# Patient Record
Sex: Female | Born: 1995 | Hispanic: Yes | Marital: Single | Smoking: Never smoker
Health system: Southern US, Community
[De-identification: ages and names within clinical notes are randomized; demographics above are authoritative.]

## PROBLEM LIST (undated history)

## (undated) DIAGNOSIS — F938 Other childhood emotional disorders: Secondary | ICD-10-CM

## (undated) DIAGNOSIS — F332 Major depressive disorder, recurrent severe without psychotic features: Secondary | ICD-10-CM

---
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
## 2017-11-23 ENCOUNTER — Ambulatory Visit: Payer: Self-pay | Admitting: Internal Medicine

## 2017-11-23 ENCOUNTER — Encounter: Payer: Self-pay | Admitting: Internal Medicine

## 2017-11-23 VITALS — BP 122/76 | HR 80 | Resp 12 | Ht <= 58 in | Wt 139.0 lb

## 2017-11-23 DIAGNOSIS — F332 Major depressive disorder, recurrent severe without psychotic features: Secondary | ICD-10-CM

## 2017-11-23 DIAGNOSIS — F938 Other childhood emotional disorders: Secondary | ICD-10-CM

## 2017-11-23 DIAGNOSIS — J302 Other seasonal allergic rhinitis: Secondary | ICD-10-CM

## 2017-11-23 DIAGNOSIS — Z30011 Encounter for initial prescription of contraceptive pills: Secondary | ICD-10-CM

## 2017-11-23 LAB — POCT URINE PREGNANCY: Preg Test, Ur: NEGATIVE

## 2017-11-23 MED ORDER — NORGESTIMATE-ETH ESTRADIOL 0.25-35 MG-MCG PO TABS: 1.0000 | ORAL_TABLET | Freq: Every day | ORAL | 4 refills | Status: DC

## 2017-11-23 MED ORDER — MOMETASONE FUROATE 50 MCG/ACT NA SUSP: NASAL | 11 refills | Status: DC

## 2017-11-23 MED ORDER — FEXOFENADINE HCL 180 MG PO TABS
ORAL_TABLET | ORAL | Status: DC
Start: 2017-11-23 — End: 2020-09-05

## 2017-11-23 NOTE — Progress Notes (Signed)
Subjective:    Patient ID: Rebekah Riddle, female    DOB: Jun 14, 1996, 22 y.o.   MRN: [id number]  HPI   Here to establish  1.  Nasal congestion that comes and goes over the past 2 months.  Started after green yellow pollen noted on cars, etc.  Clear nasal discharge.  Sneezing a lot.  More in the morning.  She has never had this before.  Obtained Allegra D 180 mg last week and has helped, but not taking regularly.  She also obtained Nasocort nasal spray, but only using when she has significant nasal stuffiness and feels that it did not help. Does have a lot of stuffed animals on bed, no carpeting.  Again, has not had these symptoms previously.   2.  Birth Control: interested in starting.  Has never been on birth control.  Hs been sexually active since age 21.  Has one long term female partner.   Menarche:  15 Periods regular, cramping first couple of days, but not heavy flow.  Lasts about 7 days.  Can have breast tenderness and bloating associated. No history of clotting disorder in family or patient. Nonsmoker.   No family history of breast cancer.  Last intercourse without a condom was 3 days ago.  Intercourse twice weekly sometimes without condoms.  Current Meds  Medication Sig  . Fexofenadine-Pseudoephedrine (ALLEGRA-D PO) Take by mouth as needed.    No Known Allergies   Past Medical History:  Diagnosis Date  . 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 MDD (major depressive disorder), recurrent episode, severe (HCC) 04/29/2015   attempted suicide and hospitalization.    No past surgical history on file.   Family History  Problem Relation Age of Onset  . Hypertension Mother   . Depression Mother   . Diabetes Father   . Hyperlipidemia Father   . Depression Sister   . Diabetes Brother   . Depression Sister     Social History   Socioeconomic History  . Marital status: Single    Spouse name: Not on file  . Number of children: 0  . Years of education: Not on  file  . Highest education level: 11th grade  Occupational History  . Not on file  Social Needs  . Financial resource strain: Not on file  . Food insecurity:    Worry: Not on file    Inability: Not on file  . Transportation needs:    Medical: Not on file    Non-medical: Not on file  Tobacco Use  . Smoking status: Never Smoker  . Smokeless tobacco: Never Used  Substance and Sexual Activity  . Alcohol use: Yes    Comment: once monthly  . Drug use: No  . Sexual activity: Never    Birth control/protection: Condom, Coitus interruptus  Lifestyle  . Physical activity:    Days per week: Not on file    Minutes per session: Not on file  . Stress: Not on file  Relationships  . Social connections:    Talks on phone: Not on file    Gets together: Not on file    Attends religious service: Not on file    Active member of club or organization: Not on file    Attends meetings of clubs or organizations: Not on file    Relationship status: Not on file  . Intimate partner violence:    Fear of current or ex partner: Not on file    Emotionally abused: Not on file  Physically abused: Not on file    Forced sexual activity: Not on file  Other Topics Concern  . Not on file  Social History Narrative  . Not on file    Review of Systems     Objective:   Physical Exam NAD HEENT:  PERRL, EOMI, conjunctivae without injection.  TMs pearly gray, throat with mild injection and no exudate.  Nasal mucosa boggy with clear discharge. Neck:  Supple, No adenopathy Chest:  CTA CV:  RRR with normal S1 and S2, No S3, S4 or murmur.  Radial and DP pulses normal and equal Abd:  S, NT, No HSM or mass, + BS       Assessment & Plan:  1.  Allergies: Sounds like really a pollen allergy.  To try fexofenadine 180 mg daily and use D form only when a lot of nasal congestion.  Encouraged use of nasonex regularly as well until pollen done.  2.  Family planning counseling:  Urine HCG negative today, but asked  she not start BCPs until the Sunday after her next period starts.  To use condoms until then and would recommend continuing condom usage until she has completed her first month.   Discussed need to take pill the same time each day.  Followup in 3 months for CPE without pap, but with STD testing.  3.  Depression/anxiety:  Have spoken with Samul DadaN. Knight. LCSW to contact patient regarding counseling.

## 2017-12-07 ENCOUNTER — Other Ambulatory Visit: Payer: Self-pay | Admitting: Licensed Clinical Social Worker

## 2017-12-20 ENCOUNTER — Other Ambulatory Visit: Payer: Self-pay | Admitting: Licensed Clinical Social Worker

## 2017-12-27 ENCOUNTER — Other Ambulatory Visit: Payer: Self-pay | Admitting: Licensed Clinical Social Worker

## 2018-01-03 ENCOUNTER — Ambulatory Visit: Payer: Self-pay | Admitting: Licensed Clinical Social Worker

## 2018-01-03 DIAGNOSIS — F3341 Major depressive disorder, recurrent, in partial remission: Secondary | ICD-10-CM

## 2018-01-03 DIAGNOSIS — F418 Other specified anxiety disorders: Secondary | ICD-10-CM
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
## 2018-01-18 ENCOUNTER — Ambulatory Visit: Payer: Self-pay | Admitting: Licensed Clinical Social Worker

## 2018-01-18 DIAGNOSIS — F3341 Major depressive disorder, recurrent, in partial remission: Secondary | ICD-10-CM

## 2018-01-18 DIAGNOSIS — F418 Other specified anxiety disorders: Secondary | ICD-10-CM
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
## 2018-02-04 ENCOUNTER — Other Ambulatory Visit: Payer: Self-pay | Admitting: Licensed Clinical Social Worker

## 2018-02-09 ENCOUNTER — Other Ambulatory Visit: Payer: Self-pay | Admitting: Licensed Clinical Social Worker

## 2018-02-23 ENCOUNTER — Ambulatory Visit: Payer: Self-pay | Admitting: Licensed Clinical Social Worker

## 2018-02-23 DIAGNOSIS — F418 Other specified anxiety disorders: Secondary | ICD-10-CM

## 2018-02-23 DIAGNOSIS — F3341 Major depressive disorder, recurrent, in partial remission: Secondary | ICD-10-CM

## 2018-02-25 ENCOUNTER — Encounter: Payer: Self-pay | Admitting: Internal Medicine

## 2018-02-25 ENCOUNTER — Ambulatory Visit: Payer: Self-pay | Admitting: Internal Medicine

## 2018-02-25 VITALS — BP 122/82 | HR 68 | Resp 12 | Ht <= 58 in | Wt 142.0 lb

## 2018-02-25 DIAGNOSIS — F332 Major depressive disorder, recurrent severe without psychotic features: Secondary | ICD-10-CM

## 2018-02-25 MED ORDER — BUPROPION HCL ER (XL) 150 MG PO TB24: ORAL_TABLET | ORAL | 1 refills | Status: DC

## 2018-02-25 MED ORDER — CLONAZEPAM 1 MG PO TABS: ORAL_TABLET | ORAL | 0 refills | Status: DC

## 2018-02-25 NOTE — Progress Notes (Signed)
   Subjective:    Patient ID: Rebekah SandhoffJaqueline Riddle, female    DOB: 12/19/1995, 22 y.o.   MRN: [id number]  HPI  1.  Family planning:  [facility] and is not interested in undergoing STD check with pelvic exam planned for today.  She has spoken to her sisters and others and decided she did not want to risk side effects of BCPs--not feeling well, gaining weight, etc.  2.  Depression:  Felt she developed depression first at age 22 yo.   Her symptoms are fatigue, body aches.  She has bad dreams and poor sleep.  Never awakens feeling rested.  Poor motivation.  Overly sensitive. These symptoms started up again with stressors Jan of this year. Attempted suicide at age 22 04/2015 that led to 1 week hospitalization.   She was placed on Fluoxetine 20 mg daily, but only took for 1 month.   She did not follow up as recommended with counseling.   She has not had suicidal thoughts anytime recently.   About 6 nights ago, had a bad night where she had an extreme fear of dying and thought someone was in her room to harm her.  Could not get this out of her mind.  She was not hearing or seeing anything.   No history of manic or hypomanic symptoms.   Was encouraged by Samul DadaN. Knight, LCSW to get back on medication with her more recent symptoms. She [facility].  No history of seizure.  No outpatient medications have been marked as taking for the 02/25/18 encounter (Office Visit) with Julieanne MansonMulberry, Dashauna Heymann, MD.    No Known Allergies  Review of Systems     Objective:   Physical Exam NAD Lungs:  CTA CV: RRR without murmur or rub.  Radial pulses normal and equal       Assessment & Plan:  1.  Family Planning:  Encouraged to continue with regular condom use for now.  2.  Depression:  Start Bupropion XL 150 mg once daily in morning.  Small Rx for Clonazepam 1 mg 1/2 to 1 tab twice daily as needed if increased anxiety with start of Buproprion. Follow up in 1  week.

## 2018-03-02 ENCOUNTER — Other Ambulatory Visit: Payer: Self-pay | Admitting: Licensed Clinical Social Worker

## 2018-03-02 NOTE — Progress Notes (Signed)
   THERAPY PROGRESS NOTE  Session Time: 60min  Participation Level: Active  Behavioral Response: Neat and Well GroomedAlertAnxious  Type of Therapy: Individual Therapy  Treatment Goals addressed: Coping  Interventions: Motivational Interviewing and Supportive  Summary: Rebekah Riddle is a [name] y.o. female who presents with an anxious mood and appropriate affect. She reported that a few days ago, she had some sort of "anxiety attack" where she began thinking deeply about death and then got incredibly scared. She shared that she was shaking, upset, and couldn't sleep the whole night. She expressed the intense fears that she feels when she thinks about the fact that she will die someday. She shared that she is coping by trying to not think about death anymore. She described the comfort that she got from her mother, which was not very supportive. Rebekah Riddle acknowledged that she might need to try an antidepressant because despite her efforts, she feels down, fatigued, and unmotivated. She shared that she is considering leaving her GED program so that she can go to work.   Suicidal/Homicidal: Nowithout intent/plan  Therapist Response: LCSW utilized supportive counseling techniques throughout the session in order to validate emotions and encourage open expression of emotion. LCSW taught Rebekah Riddle several relaxation techniques, including accupressure. LCSW encouraged her to talk with Dr. Delrae AlfredMulberry about an antidepressant. LCSW used motivation techniques to allow Rebekah Riddle think through the short-term and long-term consequences of quitting her GED program.  Plan: Return again in 2 weeks.   Rebekah Simmeratosha Kenneth Lax, LCSW 03/02/2018

## 2018-03-04 ENCOUNTER — Ambulatory Visit: Payer: Self-pay | Admitting: Licensed Clinical Social Worker

## 2018-03-04 DIAGNOSIS — F418 Other specified anxiety disorders: Secondary | ICD-10-CM

## 2018-03-04 DIAGNOSIS — F3341 Major depressive disorder, recurrent, in partial remission: Secondary | ICD-10-CM

## 2018-03-07 NOTE — Progress Notes (Signed)
   THERAPY PROGRESS NOTE  Session Time: 45min  Participation Level: Active  Behavioral Response: Neat and Well GroomedAlertEuthymic  Type of Therapy: Individual Therapy  Treatment Goals addressed: Coping  Interventions: Strength-based and Supportive  Summary: Rebekah SandhoffJaqueline Riddle is a 22 y.o. female who presents with a euthymic mood and appropriate affect. She reported that she has been feeling better since starting an antidepressant. She shared that she is still struggling with insomnia but agreed to try some of LCSW suggestions -- no screen time, reading a book, and using acupressure. Rebekah Riddle shared that she sometimes worries that she is not going out enough for a young person, as she often prefers to stay home. She discussed the benefits to going out versus staying home. She expressed concerns that she has grown distant from most of her friends over the past year. She acknowledged that she needs to work on being okay with enjoying time by herself.    Suicidal/Homicidal: Nowithout intent/plan  Therapist Response: LCSW utilized supportive counseling techniques throughout the session in order to validate emotions and encourage open expression of emotion. LCSW checked in regarding medication compliance and health issues. LCSW guided Rebekah Riddle through weighing the pros and cons of going out and helping her to identify the reasons that she wants to go out versus stay in.   Plan: Return again in 2 weeks.   Nilda Simmeratosha Yaser Harvill, LCSW 03/07/2018

## 2018-03-09 ENCOUNTER — Ambulatory Visit: Payer: Self-pay | Admitting: Internal Medicine

## 2018-03-11 ENCOUNTER — Ambulatory Visit: Payer: Self-pay | Admitting: Internal Medicine

## 2018-03-11 ENCOUNTER — Encounter: Payer: Self-pay | Admitting: Internal Medicine

## 2018-03-11 VITALS — BP 124/78 | HR 78 | Resp 12 | Ht <= 58 in | Wt 141.0 lb

## 2018-03-11 DIAGNOSIS — M545 Low back pain, unspecified: Secondary | ICD-10-CM

## 2018-03-11 DIAGNOSIS — F332 Major depressive disorder, recurrent severe without psychotic features: Secondary | ICD-10-CM

## 2018-03-11 NOTE — Progress Notes (Signed)
   Subjective:    Patient ID: Rebekah Riddle, female    DOB: 12-22-1995, 22 y.o.   MRN: [id number]  HPI  Depression:  States she is taking the Bupropion XL 150 mg daily in the morning and she feels energized and more awake.  Definitely more motivated. Took the Clonazepam 5 mg twice at bedtime for sleep and that helped as well.  She has not taken a Clonazepam since last week, but still with sleep. She has noted she is being more active during day with use of the Bupropion and is finding herself more sleepy toward bedtime now, however.  No thoughts of suicide.   She is not having the fears of dying as well.   She is not aware if anyone else in her family feels there is a difference in her. She did see Rebekah SimmerNatosha Knight, LCSW last week and will see her again next week.  Working on goals. She is making goals for bedtime and wake time.  Also brings up mid to low back pain after some heavy lifting last week.  Her back feels "tired"  Current Meds  Medication Sig  . buPROPion (WELLBUTRIN XL) 150 MG 24 hr tablet 1 tab by mouth every morning with breakfast  . clonazePAM (KLONOPIN) 1 MG tablet 1/2 to 1 tab by mouth twice daily as needed  . ibuprofen (ADVIL,MOTRIN) 200 MG tablet Take 200 mg by mouth every 8 (eight) hours as needed for cramping.    No Known Allergies Review of Systems     Objective:   Physical Exam NAD Lungs:  CTA CV:  RRR  MS:  Moves easily.  NT over spinous processes or paraspinous musculature. Neuro:  Motor 5/5, DTRs 2+/4.  Sensory of LE to light touch normal.        Assessment & Plan:  1.  Depression:  Continue bupropion.  Follow up in 4 weeks.  2.  Low back pain:  Appears mild.  Ibuprofen as needed.

## 2018-03-16 ENCOUNTER — Other Ambulatory Visit: Payer: Self-pay | Admitting: Licensed Clinical Social Worker

## 2018-03-25 ENCOUNTER — Ambulatory Visit: Payer: Self-pay | Admitting: Licensed Clinical Social Worker

## 2018-03-25 DIAGNOSIS — F418 Other specified anxiety disorders: Secondary | ICD-10-CM

## 2018-03-25 DIAGNOSIS — F3341 Major depressive disorder, recurrent, in partial remission: Secondary | ICD-10-CM

## 2018-03-28 NOTE — Progress Notes (Signed)
   THERAPY PROGRESS NOTE  Session Time: 60min  Participation Level: Active  Behavioral Response: Neat and Well GroomedAlertEuthymic  Type of Therapy: Individual Therapy  Treatment Goals addressed: Coping  Interventions: Strength-based and Supportive  Summary: Rebekah SandhoffJaqueline Riddle is a 22 y.o. female who presents with a euthymic mood and appropriate affect. She reported that she was still taking her antidepressant but felt that it wasn't doing anything and was considering stopping. She appeared receptive to LCSW feedback about sticking with it for a few more weeks before considering stopping. She reported that she did not have any negative side effects. 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 She expressed excitement to get her first paycheck and be able to get some things she wants and needs. She shared about difficulties with her boyfriend, which are making her feel very down and lonely. Rebekah Riddle and LCSW processed about their relationship and discussed what she would like to happen. She acknowledged that they probably need to break up but that it's very difficult.   Suicidal/Homicidal: Nowithout intent/plan  Therapist Response: LCSW utilized supportive counseling techniques throughout the session in order to validate emotions and encourage open expression of emotion. LCSW checked in regarding medication compliance and health issues. LCSW encouraged Rebekah Riddle to continue to taking the medication until her next doctor visit. LCSW and Rebekah Riddle processed about her relationship; LCSW normalized people growing apart as they age and mature. LCSW emphasized that Rebekah Riddle needs to think about what she wants out of the relationship and make a decision based on that.  Plan: Return again in 2 weeks.    Rebekah Simmeratosha Alayzia Pavlock, LCSW 03/28/2018

## 2018-04-05 ENCOUNTER — Other Ambulatory Visit: Payer: Self-pay | Admitting: Licensed Clinical Social Worker

## 2018-04-19 ENCOUNTER — Ambulatory Visit: Payer: Self-pay | Admitting: Internal Medicine

## 2018-04-19 ENCOUNTER — Encounter: Payer: Self-pay | Admitting: Internal Medicine

## 2018-04-19 VITALS — BP 122/72 | HR 70 | Resp 12 | Ht <= 58 in | Wt 140.0 lb

## 2018-04-19 DIAGNOSIS — Z3009 Encounter for other general counseling and advice on contraception: Secondary | ICD-10-CM

## 2018-04-19 DIAGNOSIS — F332 Major depressive disorder, recurrent severe without psychotic features: Secondary | ICD-10-CM

## 2018-04-19 MED ORDER — BUPROPION HCL ER (XL) 150 MG PO TB24: ORAL_TABLET | ORAL | 6 refills | Status: DC

## 2018-04-19 NOTE — Patient Instructions (Signed)
Take the Plan B Use condoms until next period and after period starts, start the pills the very next Sunday

## 2018-04-19 NOTE — Progress Notes (Signed)
   Subjective:    Patient ID: Rebekah Riddle, female    DOB: 10/17/1995, 22 y.o.   MRN: [id number]  HPI   Depression:  Feeling much better.  She now has a job.  Decided to drop out of GED classes as she spends so many hours with her job. She stopped the Bupropion 2 weeks ago as she did not see it was helping her.  She was not depressed, but her reasoning is that she did not note "a difference".   Relates she does not always feel depressed and so she may not need the antidepressant. Discussed the possibility that the medication and counseling may reduce the number of days in a larger period of time when she is not depressed.    Family Planning:  Today states never started Sprintec 28 days when prescribed back in April.  Difficult getting a response as to why.  She states she is bad about taking a pill every day. She states she used Plan B today. She and her partner did not use condoms with intercourse last night..    Current Meds  Medication Sig  . clonazePAM (KLONOPIN) 1 MG tablet 1/2 to 1 tab by mouth twice daily as needed      Review of Systems     Objective:   Physical Exam NAD Upbeat        Assessment & Plan:  1.  Depression:  Encouraged patient to rethink her treatment.  Did not go to last counseling session.  Stopped Bupropion. Discussed expectation of medication is to not have depressive symptoms.  Also, that she should consider giving the medication a longer trial and assess at regular intervals if her days feeling depressed are decreased from what they have been in past 4 years. Encouraged at least a 6 month trial and in reality, recommend indefinite usage with her history of depression, though at minimum, recommend a year's treatment before considering tapering off.  2.  Family planning:  Patient actually to take Plan B later today for unprotected sex.  Encouraged waiting for next period and begin BCPs the Sunday immediately after her period starts. She has the  prescription still. Discussed also Depo provera, IUD, implant, patch.  Would recommend [facility] for Depo, implant or IUD.

## 2018-06-13 ENCOUNTER — Ambulatory Visit: Payer: Self-pay | Admitting: Internal Medicine

## 2018-06-13 ENCOUNTER — Encounter: Payer: Self-pay | Admitting: Internal Medicine

## 2018-06-13 VITALS — BP 122/70 | HR 86 | Resp 12 | Ht <= 58 in | Wt 142.0 lb

## 2018-06-13 DIAGNOSIS — M62838 Other muscle spasm: Secondary | ICD-10-CM

## 2018-06-13 MED ORDER — CYCLOBENZAPRINE HCL 10 MG PO TABS: ORAL_TABLET | ORAL | 0 refills | Status: DC

## 2018-06-13 MED ORDER — IBUPROFEN 200 MG PO TABS: ORAL_TABLET | ORAL | Status: DC

## 2018-06-13 NOTE — Progress Notes (Signed)
   Subjective:    Patient ID: Rebekah Riddle, female    DOB: 11-27-1995, 22 y.o.   MRN: [id number]  HPI  Right upper back pain for past month.  Comes and goes.  Awakened this morning with the pain again.  Cannot get comfortable.  No numbness or tingling down arm or hand. Hurts to flex her neck downward.  No history of injury of which she is aware.    Has taken ibuprofen 400 mg.  Helps somewhat.   Current Meds  Medication Sig  . ibuprofen (ADVIL,MOTRIN) 200 MG tablet 3 tabs by mouth twice daily with meals for 2 weeks then as needed  . [DISCONTINUED] clonazePAM (KLONOPIN) 1 MG tablet 1/2 to 1 tab by mouth twice daily as needed  . [DISCONTINUED] ibuprofen (ADVIL,MOTRIN) 200 MG tablet Take 200 mg by mouth every 8 (eight) hours as needed for cramping.    No Known Allergies Review of Systems     Objective:   Physical Exam  NAD HEENT: PERRL, EOMI, TMs pearly gray, throat without injection. Neck:  Supple, No adenopathy.  Quite tender over right trap at mid belly with palpable spasm.  NT over spinous processes. Chest:  CTA CV:  RRR without murmur or rub.  Radial and DP pulses normal and equal Neuro:  A & O x 3, CN  II-XII grossly intact.  DTRs 2+/4, Motor 5/5, sensory grossly normal.  Gait normal      Assessment & Plan:  1.  Right trap and neck Pain:  Ibuprofen twice daily for 2 weeks.  Cyclobenzaprine as needed at bedtime.   PT referral to [facility].

## 2018-06-13 NOTE — Patient Instructions (Signed)
Warm pack neck and then stretch prior to bedtime  [facility]:  [phone]

## 2018-06-28 ENCOUNTER — Ambulatory Visit: Payer: Self-pay | Admitting: Internal Medicine

## 2018-08-14 ENCOUNTER — Encounter: Payer: Self-pay | Admitting: Internal Medicine

## 2018-09-13 ENCOUNTER — Ambulatory Visit: Payer: Self-pay | Admitting: Internal Medicine

## 2018-09-17 ENCOUNTER — Encounter: Payer: Self-pay | Admitting: Internal Medicine

## 2018-10-14 ENCOUNTER — Ambulatory Visit: Payer: Self-pay | Admitting: Internal Medicine

## 2020-09-05 ENCOUNTER — Other Ambulatory Visit: Payer: Self-pay

## 2020-09-05 ENCOUNTER — Ambulatory Visit
Admission: EM | Admit: 2020-09-05 | Discharge: 2020-09-05 | Disposition: A | Discharge status: Home / Self Care | Payer: Self-pay | Attending: Emergency Medicine | Admitting: Emergency Medicine

## 2020-09-05 DIAGNOSIS — J029 Acute pharyngitis, unspecified: Secondary | ICD-10-CM

## 2020-09-05 LAB — POCT RAPID STREP A (OFFICE): Rapid Strep A Screen: NEGATIVE

## 2020-09-05 MED ORDER — PREDNISONE 10 MG (21) PO TBPK: ORAL_TABLET | Freq: Every day | ORAL | 0 refills | Status: DC

## 2020-09-05 NOTE — Discharge Instructions (Addendum)

## 2020-09-05 NOTE — ED Triage Notes (Signed)
Pt c/o sore throat x1wk, now having swollen tonsils.

## 2020-09-05 NOTE — ED Provider Notes (Signed)
[facility]    CSN: [id number] Arrival date & time: 09/05/20  1616      History   Chief Complaint Chief Complaint  Patient presents with  . Sore Throat    HPI Rebekah Riddle is a 25 y.o. female  History was provided by the patient. Rebekah Riddle is a 25 y.o. female who presents for evaluation of a sore throat. Associated symptoms include sore throat. Onset of symptoms was 1 week ago, gradually worsening since that time.  She is drinking plenty of fluids. She has not had recent close exposure to someone with proven streptococcal pharyngitis. The following portions of the patient's history were reviewed and updated as appropriate: allergies, current medications, past family history, past medical history, past social history, past surgical history and problem list.     Past Medical History:  Diagnosis Date  . 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 MDD (major depressive disorder), recurrent episode, severe (HCC) 04/29/2015   attempted suicide and hospitalization.    Patient Active Problem List   Diagnosis Date Noted  . MDD (major depressive disorder), recurrent episode, severe (HCC) 04/29/2015  . 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 No pertinent surgical history.  OB History   No obstetric history on file.      Home Medications    Prior to Admission medications   Medication Sig Start Date End Date Taking? Authorizing Provider  predniSONE (STERAPRED UNI-PAK 21 TAB) 10 MG (21) TBPK tablet Take by mouth daily. Take steroid taper as written 09/05/20  Yes Hall-Potvin, [place], PA-C    Family History Family History  Problem Relation Age of Onset  . Hypertension Mother   . Depression Mother   . Diabetes Father   . Hyperlipidemia Father   . Depression Sister   . Diabetes Brother   . Depression Sister     Social History Social History   Tobacco Use  . Smoking status: Never Smoker  . Smokeless tobacco: Never  Used  Vaping Use  . Vaping Use: Never used  Substance Use Topics  . Alcohol use: Yes    Comment: once monthly  . Drug use: No     Allergies   Patient has no known allergies.   Review of Systems Review of Systems  Constitutional: Negative for fatigue and fever.  HENT: Positive for sore throat. Negative for congestion, dental problem, ear pain, facial swelling, hearing loss, sinus pain, trouble swallowing and voice change.   Eyes: Negative for photophobia, pain and visual disturbance.  Respiratory: Negative for cough and shortness of breath.   Cardiovascular: Negative for chest pain and palpitations.  Gastrointestinal: Negative for diarrhea and vomiting.  Musculoskeletal: Negative for arthralgias and myalgias.  Neurological: Negative for dizziness and headaches.     Physical Exam Triage Vital Signs ED Triage Vitals  Enc Vitals Group     BP      Pulse      Resp      Temp      Temp src      SpO2      Weight      Height      Head Circumference      Peak Flow      Pain Score      Pain Loc      Pain Edu?      Excl. in GC?    No data found.  Updated Vital Signs BP 126/85   Pulse 70   Temp 98.5 F (36.9  C)   Resp 16   LMP 08/25/2020   SpO2 99%   Visual Acuity Right Eye Distance:   Left Eye Distance:   Bilateral Distance:    Right Eye Near:   Left Eye Near:    Bilateral Near:     Physical Exam Constitutional:      General: She is not in acute distress.    Appearance: She is not ill-appearing or diaphoretic.  HENT:     Head: Normocephalic and atraumatic.     Right Ear: Tympanic membrane and ear canal normal.     Left Ear: Tympanic membrane and ear canal normal.     Mouth/Throat:     Mouth: Mucous membranes are moist.     Pharynx: Oropharynx is clear. Uvula midline. Posterior oropharyngeal erythema present. No oropharyngeal exudate or uvula swelling.     Tonsils: No tonsillar exudate. 2+ on the right. 2+ on the left.  Eyes:     General: No scleral  icterus.    Conjunctiva/sclera: Conjunctivae normal.     Pupils: Pupils are equal, round, and reactive to light.  Neck:     Comments: Trachea midline, negative JVD Cardiovascular:     Rate and Rhythm: Normal rate and regular rhythm.     Heart sounds: No murmur heard. No gallop.   Pulmonary:     Effort: Pulmonary effort is normal. No respiratory distress.     Breath sounds: No wheezing, rhonchi or rales.  Musculoskeletal:     Cervical back: Neck supple. No tenderness.  Lymphadenopathy:     Cervical: No cervical adenopathy.  Skin:    Capillary Refill: Capillary refill takes less than 2 seconds.     Coloration: Skin is not jaundiced or pale.     Findings: No rash.  Neurological:     General: No focal deficit present.     Mental Status: She is alert and oriented to person, place, and time.      UC Treatments / Results  Labs (all labs ordered are listed, but only abnormal results are displayed) Labs Reviewed  CULTURE, GROUP A STREP ([facility])  NOVEL CORONAVIRUS, NAA  POCT RAPID STREP A (OFFICE)    EKG   Radiology No results found.  Procedures Procedures (including critical care time)  Medications Ordered in UC Medications - No data to display  Initial Impression / Assessment and Plan / UC Course  I have reviewed the triage vital signs and the nursing notes.  Pertinent labs & imaging results that were available during my care of the patient were reviewed by me and considered in my medical decision making (see chart for details).     Patient afebrile, nontoxic, with SpO2 99%.  Rapid strep negative, culture and Covid PCR pending.  Patient to quarantine until results are back.  We will treat supportively as outlined below.  Return precautions discussed, patient verbalized understanding and is agreeable to plan. Final Clinical Impressions(s) / UC Diagnoses   Final diagnoses:  Sore throat     Discharge Instructions     Your rapid strep test was negative today.  The  culture is pending.  Please look on your MyChart for test results.   We will notify you if the culture positive and outline a treatment plan at that time.   Please continue Tylenol and/or Ibuprofen as needed for fever, pain.  May try warm salt water gargles, cepacol lozenges, throat spray, warm tea or water with lemon/honey, or OTC cold relief medicine for throat discomfort.   For  congestion: take a daily anti-histamine like Zyrtec, Claritin, and a oral decongestant to help with post nasal drip that may be irritating your throat.   It is important to stay hydrated: drink plenty of fluids (primarily water) to keep your throat moisturized and help further relieve irritation/discomfort.     ED Prescriptions    Medication Sig Dispense Auth. Provider   predniSONE (STERAPRED UNI-PAK 21 TAB) 10 MG (21) TBPK tablet Take by mouth daily. Take steroid taper as written 21 tablet Hall-Potvin, [place], PA-C     PDMP not reviewed this encounter.   Odette Fraction [place], [place] 09/05/20 1814

## 2020-09-06 LAB — SARS-COV-2, NAA 2 DAY TAT

## 2020-09-06 LAB — NOVEL CORONAVIRUS, NAA: SARS-CoV-2, NAA: NOT DETECTED

## 2020-09-08 LAB — CULTURE, GROUP A STREP (THRC)

## 2020-09-11 ENCOUNTER — Emergency Department (HOSPITAL_COMMUNITY)
Admission: EM | Admit: 2020-09-11 | Discharge: 2020-09-11 | Disposition: A | Discharge status: Home / Self Care | Payer: Self-pay | Attending: Emergency Medicine | Admitting: Emergency Medicine

## 2020-09-11 ENCOUNTER — Encounter (HOSPITAL_COMMUNITY): Payer: Self-pay | Admitting: Emergency Medicine

## 2020-09-11 DIAGNOSIS — J36 Peritonsillar abscess: Secondary | ICD-10-CM | POA: Insufficient documentation

## 2020-09-11 DIAGNOSIS — J029 Acute pharyngitis, unspecified: Secondary | ICD-10-CM

## 2020-09-11 LAB — COMPREHENSIVE METABOLIC PANEL
ALT: 14 U/L (ref 0–44)
AST: 16 U/L (ref 15–41)
Albumin: 3.9 g/dL (ref 3.5–5.0)
Alkaline Phosphatase: 60 U/L (ref 38–126)
Anion gap: 11 (ref 5–15)
BUN: 9 mg/dL (ref 6–20)
CO2: 25 mmol/L (ref 22–32)
Calcium: 9.1 mg/dL (ref 8.9–10.3)
Chloride: 101 mmol/L (ref 98–111)
Creatinine, Ser: 0.55 mg/dL (ref 0.44–1.00)
GFR, Estimated: >60 mL/min (ref >60)
Glucose, Bld: 90 mg/dL (ref 70–99)
Potassium: 3.4 mmol/L — ABNORMAL LOW (ref 3.5–5.1)
Sodium: 137 mmol/L (ref 135–145)
Total Bilirubin: 0.7 mg/dL (ref 0.3–1.2)
Total Protein: 7.4 g/dL (ref 6.5–8.1)

## 2020-09-11 LAB — CBC WITH DIFFERENTIAL/PLATELET
Abs Immature Granulocytes: 0.16 10*3/uL — ABNORMAL HIGH (ref 0.00–0.07)
Basophils Absolute: 0.1 10*3/uL (ref 0.0–0.1)
Basophils Relative: 0 %
Eosinophils Absolute: 0.2 10*3/uL (ref 0.0–0.5)
Eosinophils Relative: 1 %
HCT: 38.2 % (ref 36.0–46.0)
Hemoglobin: 11.7 g/dL — ABNORMAL LOW (ref 12.0–15.0)
Immature Granulocytes: 1 %
Lymphocytes Relative: 24 %
Lymphs Abs: 3.4 10*3/uL (ref 0.7–4.0)
MCH: 25.5 pg — ABNORMAL LOW (ref 26.0–34.0)
MCHC: 30.6 g/dL (ref 30.0–36.0)
MCV: 83.4 fL (ref 80.0–100.0)
Monocytes Absolute: 1.3 10*3/uL — ABNORMAL HIGH (ref 0.1–1.0)
Monocytes Relative: 10 %
Neutro Abs: 8.8 10*3/uL — ABNORMAL HIGH (ref 1.7–7.7)
Neutrophils Relative %: 64 %
Platelets: 398 10*3/uL (ref 150–400)
RBC: 4.58 MIL/uL (ref 3.87–5.11)
RDW: 13.5 % (ref 11.5–15.5)
WBC: 13.9 10*3/uL — ABNORMAL HIGH (ref 4.0–10.5)
nRBC: 0 % (ref 0.0–0.2)

## 2020-09-11 LAB — I-STAT BETA HCG BLOOD, ED (MC, WL, AP ONLY): I-stat hCG, quantitative: <5 m[IU]/mL (ref <5)

## 2020-09-11 MED ORDER — KETOROLAC TROMETHAMINE 60 MG/2ML IM SOLN
60.0000 mg | Freq: Once | INTRAMUSCULAR | Status: AC
  Administered 2020-09-11: 60 mg via INTRAMUSCULAR
  Filled 2020-09-11 (×2): qty 2

## 2020-09-11 MED ORDER — CLINDAMYCIN PALMITATE HCL 75 MG/5ML PO SOLR
300.0000 mg | Freq: Once | ORAL | Status: AC
  Administered 2020-09-11: 300 mg via ORAL
  Filled 2020-09-11: qty 20

## 2020-09-11 MED ORDER — BENZOCAINE 20 % MT AERO
INHALATION_SPRAY | Freq: Once | OROMUCOSAL | Status: AC
  Administered 2020-09-11: 1 via OROMUCOSAL
  Filled 2020-09-11: qty 57

## 2020-09-11 MED ORDER — DEXAMETHASONE 10 MG/ML FOR PEDIATRIC ORAL USE
10.0000 mg | Freq: Once | INTRAMUSCULAR | Status: AC
  Administered 2020-09-11: 10 mg via ORAL
  Filled 2020-09-11: qty 1

## 2020-09-11 MED ORDER — CLINDAMYCIN HCL 150 MG PO CAPS: 150.0000 mg | ORAL_CAPSULE | Freq: Three times a day (TID) | ORAL | 0 refills | Status: DC

## 2020-09-11 NOTE — ED Triage Notes (Signed)
Patient complains of painful swollen tonsil on right of throat. Seen at urgent care 6 days ago for same, negative for strep and COVID. Was prescribed PO antibiotics with no relief.

## 2020-09-11 NOTE — ED Provider Notes (Signed)
[facility] EMERGENCY DEPARTMENT Provider Note   CSN: [id number] Arrival date & time: 09/11/20  6387     History Chief Complaint  Patient presents with  . Sore Throat    Rebekah Riddle is a 25 y.o. female.  Patient with history of anxiety presents with worsening swelling right tonsil area for 1 week.  Patient was seen in urgent care 6 days ago had negative strep and Covid testing.  Patient been taking oral medication prednisone and a medication from [place] however no improvement.  No fevers or chills.  No shortness of breath however swelling worsening.        Past Medical History:  Diagnosis Date  . 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 MDD (major depressive disorder), recurrent episode, severe (HCC) 04/29/2015   attempted suicide and hospitalization.    Patient Active Problem List   Diagnosis Date Noted  . MDD (major depressive disorder), recurrent episode, severe (HCC) 04/29/2015  . 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 No pertinent surgical history.   OB History   No obstetric history on file.     Family History  Problem Relation Age of Onset  . Hypertension Mother   . Depression Mother   . Diabetes Father   . Hyperlipidemia Father   . Depression Sister   . Diabetes Brother   . Depression Sister     Social History   Tobacco Use  . Smoking status: Never Smoker  . Smokeless tobacco: Never Used  Vaping Use  . Vaping Use: Never used  Substance Use Topics  . Alcohol use: Yes    Comment: once monthly  . Drug use: No    Home Medications Prior to Admission medications   Medication Sig Start Date End Date Taking? Authorizing Provider  predniSONE (STERAPRED UNI-PAK 21 TAB) 10 MG (21) TBPK tablet Take by mouth daily. Take steroid taper as written 09/05/20   Hall-Potvin, [place], PA-C    Allergies    Patient has no known allergies.  Review of Systems   Review of Systems  Constitutional: Negative  for chills and fever.  HENT: Positive for congestion and sore throat.   Eyes: Negative for visual disturbance.  Respiratory: Negative for shortness of breath.   Cardiovascular: Negative for chest pain.  Gastrointestinal: Negative for abdominal pain and vomiting.  Genitourinary: Negative for dysuria and flank pain.  Musculoskeletal: Negative for back pain, neck pain and neck stiffness.  Skin: Negative for rash.  Neurological: Negative for light-headedness and headaches.    Physical Exam Updated Vital Signs BP 125/80 (BP Location: Left Arm)   Pulse 68   Temp 99.1 F (37.3 C) (Oral)   Resp 16   LMP 08/25/2020   SpO2 97%   Physical Exam Vitals and nursing note reviewed.  Constitutional:      Appearance: She is well-developed and well-nourished.  HENT:     Head: Normocephalic and atraumatic.     Mouth/Throat:     Tonsils: Tonsillar abscess present. 3+ on the right.  Eyes:     General:        Right eye: No discharge.        Left eye: No discharge.     Conjunctiva/sclera: Conjunctivae normal.  Neck:     Trachea: No tracheal deviation.  Cardiovascular:     Rate and Rhythm: Normal rate.  Pulmonary:     Effort: Pulmonary effort is normal.     Breath sounds: Normal breath sounds.  Abdominal:  General: There is no distension.     Palpations: Abdomen is soft.     Tenderness: There is no abdominal tenderness. There is no guarding.  Musculoskeletal:        General: No edema.     Cervical back: Normal range of motion and neck supple.  Lymphadenopathy:     Cervical: Cervical adenopathy (right anterior) present.  Skin:    General: Skin is warm.     Findings: No rash.  Neurological:     Mental Status: She is alert and oriented to person, place, and time.  Psychiatric:        Mood and Affect: Mood and affect normal.     ED Results / Procedures / Treatments   Labs (all labs ordered are listed, but only abnormal results are displayed) Labs Reviewed  CBC WITH  DIFFERENTIAL/PLATELET - Abnormal; Notable for the following components:      Result Value   WBC 13.9 (*)    Hemoglobin 11.7 (*)    MCH 25.5 (*)    Neutro Abs 8.8 (*)    Monocytes Absolute 1.3 (*)    Abs Immature Granulocytes 0.16 (*)    All other components within normal limits  COMPREHENSIVE METABOLIC PANEL  I-STAT BETA HCG BLOOD, ED ([facility], [facility], [facility] ONLY)    EKG None  Radiology No results found.  Procedures .Marland KitchenIncision and Drainage  Date/Time: 09/11/2020 11:42 AM Performed by: Blane Ohara, MD Authorized by: Blane Ohara, MD   Consent:    Consent obtained:  Verbal   Consent given by:  Patient   Risks, benefits, and alternatives were discussed: yes     Risks discussed:  Bleeding, infection, damage to other organs, incomplete drainage and pain   Alternatives discussed:  No treatment Universal protocol:    Procedure explained and questions answered to patient or proxy's satisfaction: yes     Relevant documents present and verified: yes     Patient identity confirmed:  Verbally with patient and arm band Location:    Type:  Abscess   Size:  3 cm   Location:  Mouth   Mouth location:  Peritonsillar Sedation:    Sedation type:  None Anesthesia:    Anesthesia method:  Topical application   Topical anesthetic:  Benzocaine gel Procedure type:    Complexity:  Complex Procedure details:    Ultrasound guidance: no     Needle aspiration: yes     Needle size:  18 G   Incision depth:  Submucosal   Drainage:  Purulent   Drainage amount:  Copious   Wound treatment:  Wound left open   Packing materials:  None Post-procedure details:    Procedure completion:  Tolerated     Medications Ordered in ED Medications  Benzocaine (HURRCAINE) 20 % mouth spray (has no administration in time range)  dexamethasone (DECADRON) 10 MG/ML injection for Pediatric ORAL use 10 mg (has no administration in time range)  clindamycin (CLEOCIN) 75 MG/5ML solution 300 mg (has no administration in  time range)  ketorolac (TORADOL) injection 60 mg (has no administration in time range)    ED Course  I have reviewed the triage vital signs and the nursing notes.  Pertinent labs & imaging results that were available during my care of the patient were reviewed by me and considered in my medical decision making (see chart for details).    MDM Rules/Calculators/A&P  Patient presents with worsening sore throat on the right side.  Clinical concern for tonsillar abscess.  Discussed risks and benefits of drainage attempt using needle 18-gauge.  Patient comfortable this plan.  Pain medicines ordered, Hurricaine spray awaiting from pharmacy.  First dose antibiotics will be given after drainage.  Peritonsillar abscess drained without difficulty with assistance from nurse practitioner.  Discussed follow-up and outpatient treatment.  Final Clinical Impression(s) / ED Diagnoses Final diagnoses:  Peritonsillar abscess  Acute pharyngitis, unspecified etiology    Rx / DC Orders ED Discharge Orders    None       Blane Ohara, MD 09/11/20 1144

## 2020-09-11 NOTE — Discharge Instructions (Signed)
Take antibiotics as directed. If you develop worsening swelling, persistent fevers or no improvement see local ear nose and throat doctor Friday. Take Tylenol every 4 hours and ibuprofen every 6 hours for pain and swelling. Stay well-hydrated.

## 2021-08-30 ENCOUNTER — Emergency Department (HOSPITAL_COMMUNITY): Payer: Self-pay

## 2021-08-30 ENCOUNTER — Emergency Department (HOSPITAL_COMMUNITY)
Admission: EM | Admit: 2021-08-30 | Discharge: 2021-08-31 | Disposition: A | Discharge status: Home / Self Care | Payer: Self-pay | Attending: Emergency Medicine | Admitting: Emergency Medicine

## 2021-08-30 ENCOUNTER — Encounter (HOSPITAL_COMMUNITY): Payer: Self-pay | Admitting: *Deleted

## 2021-08-30 ENCOUNTER — Other Ambulatory Visit: Payer: Self-pay

## 2021-08-30 DIAGNOSIS — Z79899 Other long term (current) drug therapy: Secondary | ICD-10-CM | POA: Insufficient documentation

## 2021-08-30 DIAGNOSIS — R1013 Epigastric pain: Secondary | ICD-10-CM | POA: Insufficient documentation

## 2021-08-30 LAB — URINALYSIS, ROUTINE W REFLEX MICROSCOPIC
Bilirubin Urine: NEGATIVE
Glucose, UA: NEGATIVE mg/dL
Hgb urine dipstick: NEGATIVE
Ketones, ur: NEGATIVE mg/dL
Nitrite: NEGATIVE
Protein, ur: NEGATIVE mg/dL
Specific Gravity, Urine: 1.01 (ref 1.005–1.030)
pH: 5.5 (ref 5.0–8.0)

## 2021-08-30 LAB — URINALYSIS, MICROSCOPIC (REFLEX)

## 2021-08-30 LAB — I-STAT BETA HCG BLOOD, ED (MC, WL, AP ONLY): I-stat hCG, quantitative: <5 m[IU]/mL (ref <5)

## 2021-08-30 MED ORDER — HYDROCODONE-ACETAMINOPHEN 5-325 MG PO TABS
2.0000 | ORAL_TABLET | Freq: Once | ORAL | Status: AC
  Administered 2021-08-30: 2 via ORAL
  Filled 2021-08-30: qty 2

## 2021-08-30 MED ORDER — ONDANSETRON 4 MG PO TBDP
4.0000 mg | ORAL_TABLET | Freq: Once | ORAL | Status: AC
  Administered 2021-08-30: 4 mg via ORAL
  Filled 2021-08-30: qty 1

## 2021-08-30 NOTE — ED Notes (Signed)
Patient aware that we need urine sample for testing, unable at this time. Pt given instruction on providing urine sample when able to do so.   

## 2021-08-30 NOTE — ED Triage Notes (Signed)
Abd pain n v diarrhea after taking some med  abd pain for several days worse since yesterday.  Lmp jan 2nd

## 2021-08-30 NOTE — ED Provider Triage Note (Signed)
Emergency Medicine Provider Triage Evaluation Note  Luana Herzberger , a 26 y.o. female  was evaluated in triage.  Pt complains of abdominal pain and nausea and vomiting.  She states that the pain is in her upper abdomen and radiates to the right side.  She had the symptoms a couple of days ago, but it resolved on its own.  She denies prior abdominal surgeries.  She states that she does use alcohol, but has not recently.  She denies fevers or chills.  Denies treatments prior to arrival.  Review of Systems  Positive: Abdominal pain, nausea, vomiting Negative: Fever, chills  Physical Exam  BP 125/84    Pulse (!) 104    Temp 98.3 F (36.8 C)    Resp (!) 22    Ht 4\' 9"  (1.448 m)    Wt 64.4 kg    SpO2 100%    BMI 30.72 kg/m  Gen:   Awake, no distress   Resp:  Normal effort  MSK:   Moves extremities without difficulty  Other:  Epigastric tenderness  Medical Decision Making  Medically screening exam initiated at 11:05 PM.  Appropriate orders placed.  Ahsley Heist was informed that the remainder of the evaluation will be completed by another provider, this initial triage assessment does not replace that evaluation, and the importance of remaining in the ED until their evaluation is complete.  Abdominal pain   Montine Circle, PA-C 08/30/21 2306

## 2021-08-31 LAB — CBC WITH DIFFERENTIAL/PLATELET
Abs Immature Granulocytes: 0 10*3/uL (ref 0.00–0.07)
Basophils Absolute: 0 10*3/uL (ref 0.0–0.1)
Basophils Relative: 0 %
Eosinophils Absolute: 0.1 10*3/uL (ref 0.0–0.5)
Eosinophils Relative: 1 %
HCT: 41 % (ref 36.0–46.0)
Hemoglobin: 13.6 g/dL (ref 12.0–15.0)
Lymphocytes Relative: 37 %
Lymphs Abs: 4.4 10*3/uL — ABNORMAL HIGH (ref 0.7–4.0)
MCH: 29 pg (ref 26.0–34.0)
MCHC: 33.2 g/dL (ref 30.0–36.0)
MCV: 87.4 fL (ref 80.0–100.0)
Monocytes Absolute: 0.5 10*3/uL (ref 0.1–1.0)
Monocytes Relative: 4 %
Neutro Abs: 6.9 10*3/uL (ref 1.7–7.7)
Neutrophils Relative %: 58 %
Platelets: 315 10*3/uL (ref 150–400)
RBC: 4.69 MIL/uL (ref 3.87–5.11)
RDW: 13.1 % (ref 11.5–15.5)
WBC: 11.9 10*3/uL — ABNORMAL HIGH (ref 4.0–10.5)
nRBC: 0 % (ref 0.0–0.2)
nRBC: 0 /100 WBC

## 2021-08-31 LAB — COMPREHENSIVE METABOLIC PANEL
ALT: 25 U/L (ref 0–44)
AST: 24 U/L (ref 15–41)
Albumin: 4.7 g/dL (ref 3.5–5.0)
Alkaline Phosphatase: 79 U/L (ref 38–126)
Anion gap: 12 (ref 5–15)
BUN: 7 mg/dL (ref 6–20)
CO2: 21 mmol/L — ABNORMAL LOW (ref 22–32)
Calcium: 9.6 mg/dL (ref 8.9–10.3)
Chloride: 105 mmol/L (ref 98–111)
Creatinine, Ser: 0.55 mg/dL (ref 0.44–1.00)
GFR, Estimated: >60 mL/min (ref >60)
Glucose, Bld: 103 mg/dL — ABNORMAL HIGH (ref 70–99)
Potassium: 3.6 mmol/L (ref 3.5–5.1)
Sodium: 138 mmol/L (ref 135–145)
Total Bilirubin: 0.6 mg/dL (ref 0.3–1.2)
Total Protein: 8.2 g/dL — ABNORMAL HIGH (ref 6.5–8.1)

## 2021-08-31 LAB — CBG MONITORING, ED: Glucose-Capillary: 100 mg/dL — ABNORMAL HIGH (ref 70–99)

## 2021-08-31 LAB — LIPASE, BLOOD: Lipase: 38 U/L (ref 11–51)

## 2021-08-31 MED ORDER — ALUM & MAG HYDROXIDE-SIMETH 200-200-20 MG/5ML PO SUSP
30.0000 mL | Freq: Once | ORAL | Status: AC
Start: 2021-08-31 — End: 2021-08-31
  Administered 2021-08-31: 30 mL via ORAL
  Filled 2021-08-31: qty 30

## 2021-08-31 MED ORDER — ONDANSETRON HCL 4 MG/2ML IJ SOLN
4.0000 mg | Freq: Once | INTRAMUSCULAR | Status: AC
  Administered 2021-08-31: 4 mg via INTRAVENOUS
  Filled 2021-08-31: qty 2

## 2021-08-31 MED ORDER — ONDANSETRON HCL 4 MG PO TABS: 4.0000 mg | ORAL_TABLET | Freq: Four times a day (QID) | ORAL | 0 refills | Status: DC

## 2021-08-31 MED ORDER — OMEPRAZOLE 20 MG PO CPDR
20.0000 mg | DELAYED_RELEASE_CAPSULE | Freq: Every day | ORAL | 0 refills | Status: AC
Start: 2021-08-31 — End: ?

## 2021-08-31 MED ORDER — SODIUM CHLORIDE 0.9 % IV BOLUS
1000.0000 mL | Freq: Once | INTRAVENOUS | Status: AC
  Administered 2021-08-31: 1000 mL via INTRAVENOUS

## 2021-08-31 NOTE — ED Provider Notes (Signed)
[facility] EMERGENCY DEPARTMENT Provider Note   CSN: [id number] Arrival date & time: 08/30/21  2242     History  Chief Complaint  Patient presents with   Abdominal Pain    Rebekah Riddle is a 26 y.o. female.  The history is provided by the patient. No language interpreter was used.  Abdominal Pain  26 year old female significant history of depression and anxiety along with occasional alcohol use who presents for evaluation of abdominal pain.  Patient endorsed recurrent upper abdominal pain ongoing for the past 5 days.  She described pain as an uncomfortable sensation to her upper abdomen worse with eating.  Initially she endorsed having bouts of nausea, feeling a bit constipated in which she took some laxative and subsequently had multiple bouts of loose stools.  That has since improved.  She symptoms got better for a few days but for the past 2 days it seems to return.  She endorsed indigestion, nausea, feeling weak.  She does not endorse fever productive cough shortness of breath chest pain or sore throat.  She denies any dysuria vaginal bleeding or vaginal discharge.  No prior abdominal surgeries.  She did notice increasing discomfort after eating spicy food.  Home Medications Prior to Admission medications   Medication Sig Start Date End Date Taking? Authorizing Provider  clindamycin (CLEOCIN) 150 MG capsule Take 1 capsule (150 mg total) by mouth 3 (three) times daily. 09/11/20   Elnora Morrison, MD  predniSONE (STERAPRED UNI-PAK 21 TAB) 10 MG (21) TBPK tablet Take by mouth daily. Take steroid taper as written 09/05/20   Hall-Potvin, [place], PA-C      Allergies    Patient has no known allergies.    Review of Systems   Review of Systems  Gastrointestinal:  Positive for abdominal pain.  All other systems reviewed and are negative.  Physical Exam Updated Vital Signs BP 125/84    Pulse (!) 107    Temp 98.9 F (37.2 C) (Oral)    Resp 15    Ht 4\' 9"  (1.448 m)     Wt 64.4 kg    SpO2 100%    BMI 30.72 kg/m  Physical Exam Vitals and nursing note reviewed.  Constitutional:      General: She is not in acute distress.    Appearance: She is well-developed.  HENT:     Head: Atraumatic.  Eyes:     Conjunctiva/sclera: Conjunctivae normal.  Cardiovascular:     Rate and Rhythm: Normal rate and regular rhythm.     Heart sounds: Normal heart sounds.  Pulmonary:     Effort: Pulmonary effort is normal.  Abdominal:     General: Abdomen is flat. Bowel sounds are normal.     Tenderness: There is abdominal tenderness in the epigastric area. There is no right CVA tenderness, left CVA tenderness, guarding or rebound. Negative signs include Murphy's sign, Rovsing's sign and McBurney's sign.     Hernia: No hernia is present.  Musculoskeletal:     Cervical back: Neck supple.  Skin:    Findings: No rash.  Neurological:     Mental Status: She is alert.  Psychiatric:        Mood and Affect: Mood normal.    ED Results / Procedures / Treatments   Labs (all labs ordered are listed, but only abnormal results are displayed) Labs Reviewed  COMPREHENSIVE METABOLIC PANEL - Abnormal; Notable for the following components:      Result Value   CO2 21 (*)  Glucose, Bld 103 (*)    Total Protein 8.2 (*)    All other components within normal limits  CBC WITH DIFFERENTIAL/PLATELET - Abnormal; Notable for the following components:   WBC 11.9 (*)    Lymphs Abs 4.4 (*)    All other components within normal limits  URINALYSIS, ROUTINE W REFLEX MICROSCOPIC - Abnormal; Notable for the following components:   Leukocytes,Ua SMALL (*)    All other components within normal limits  URINALYSIS, MICROSCOPIC (REFLEX) - Abnormal; Notable for the following components:   Bacteria, UA RARE (*)    All other components within normal limits  CBG MONITORING, ED - Abnormal; Notable for the following components:   Glucose-Capillary 100 (*)    All other components within normal limits   LIPASE, BLOOD  I-STAT BETA HCG BLOOD, ED ([facility], [facility], [facility] ONLY)    EKG None  Radiology US Abdomen Limited  Result Date: 08/30/2021 CLINICAL DATA:  Right upper quadrant pain. EXAM: ULTRASOUND ABDOMEN LIMITED RIGHT UPPER QUADRANT COMPARISON:  None. FINDINGS: Gallbladder: No gallstones or wall thickening visualized (1.6 mm). No sonographic Murphy sign noted by sonographer. Common bile duct: Diameter: 2.7 mm Liver: No focal lesion identified. Within normal limits in parenchymal echogenicity. Portal vein is patent on color Doppler imaging with normal direction of blood flow towards the liver. Other: None. IMPRESSION: Normal right upper quadrant ultrasound. Electronically Signed   By: Virgina Norfolk M.D.   On: 08/30/2021 23:55    Procedures Procedures    Medications Ordered in ED Medications  ondansetron (ZOFRAN-ODT) disintegrating tablet 4 mg (4 mg Oral Given 08/30/21 2307)  HYDROcodone-acetaminophen (NORCO/VICODIN) 5-325 MG per tablet 2 tablet (2 tablets Oral Given 08/30/21 2307)  alum & mag hydroxide-simeth (MAALOX/MYLANTA) 200-200-20 MG/5ML suspension 30 mL (30 mLs Oral Given 08/31/21 0856)  ondansetron (ZOFRAN) injection 4 mg (4 mg Intravenous Given 08/31/21 0856)  sodium chloride 0.9 % bolus 1,000 mL (0 mLs Intravenous Stopped 08/31/21 1023)    ED Course/ Medical Decision Making/ A&P                           Medical Decision Making  BP 125/84    Pulse (!) 107    Temp 98.9 F (37.2 C) (Oral)    Resp 15    Ht 4\' 9"  (1.448 m)    Wt 64.4 kg    SpO2 100%    BMI 30.72 kg/m   8:03 AM Patient endorses recurrent upper abdominal discomfort for the past 5 days worse with eating spicy food.  She still endorse some discomfort at this time along with nausea.  Last bowel movement was yesterday.  On exam, mild epigastric tenderness without guarding or rebound tenderness.  Negative Murphy sign, no pain at McBurney's point.  I suspect her abdominal discomfort is likely gastric irritation possibly  gastritis from spicy food.  I have low suspicion for biliary disease, appendicitis, colitis, pancreatitis, SBO, ischemic bowel, or other acute emergent abdominal pathology.  I have reviewed and independently interpreted labs.  Patient is currently not pregnant, urine without signs of urinary tract infection, her electrolyte panels are reassuring, normal lipase, mildly elevated white count of 11.9.  A limited abdominal ultrasound that was reviewed independently interpreted by me which shows no evidence of gallstones or wall thickening.  It is essentially a normal right upper quadrant ultrasound.  At this time I plan to provide patient with symptom control which includes IV fluid, antinausea medication, and a GI cocktail.  Final Clinical Impression(s) / ED Diagnoses Final diagnoses:  Epigastric pain    Rx / DC Orders ED Discharge Orders          Ordered    omeprazole (PRILOSEC) 20 MG capsule  Daily        08/31/21 1147    ondansetron (ZOFRAN) 4 MG tablet  Every 6 hours        08/31/21 1147              Domenic Moras, PA-C 08/31/21 1149    Sherwood Gambler, MD 08/31/21 1544

## 2021-08-31 NOTE — ED Notes (Signed)
Pt is here for mid upper abdominal pain x 4 days with nausea and dizziness. Able to tolerate solid foods with last PO was yesterday at 1300. Reports nausea with no vomiting. Last BM was yesterday. Took a laxative on Thursday with diarrhea but normal BM since Friday. Currently denies pain since pt was given vicodin at 2300 last night. Will continue to monitor.

## 2021-08-31 NOTE — Discharge Instructions (Signed)
Your abdominal pain is likely due to inflammation.  Please take medication as prescribed.  Follow-up with your primary care doctor for further care.  If you do not have one, use number below to find a primary care provider.  Return to the ER if you have any concern

## 2022-01-31 IMAGING — US US ABDOMEN LIMITED
1 series · 14 of 25 positions shown · non-contrast
Comparison: None.

CLINICAL DATA: Right upper quadrant pain.

EXAM:
ULTRASOUND ABDOMEN LIMITED RIGHT UPPER QUADRANT

[Series 1: us abdomen limited · 14 of 33 slices shown]
[im 1/33]
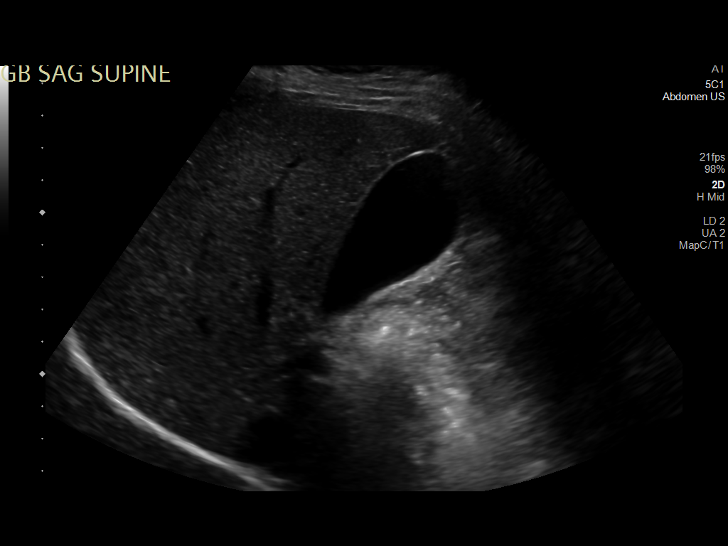
[im 3/33]
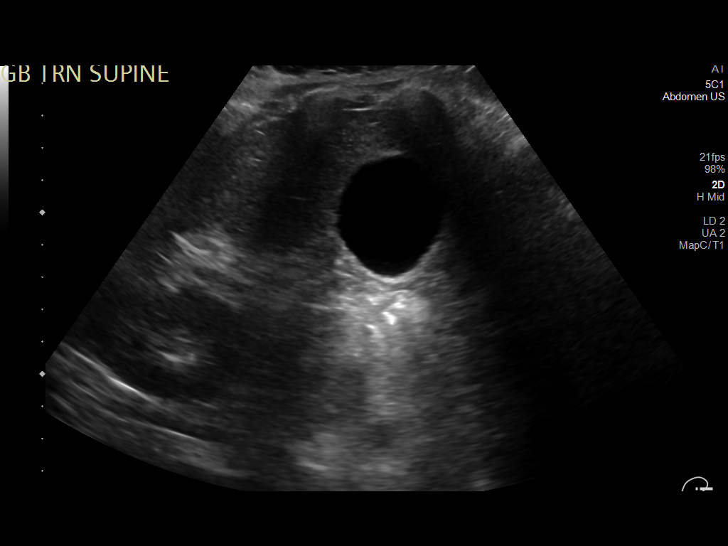
[im 6/33]
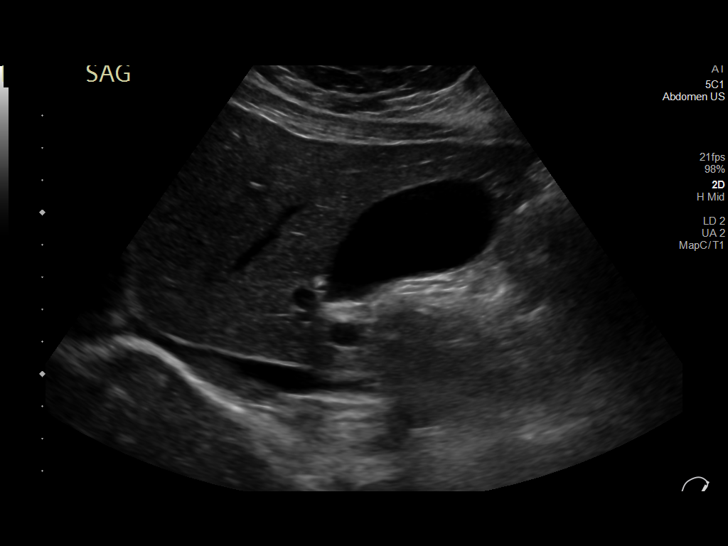
[im 9/33]
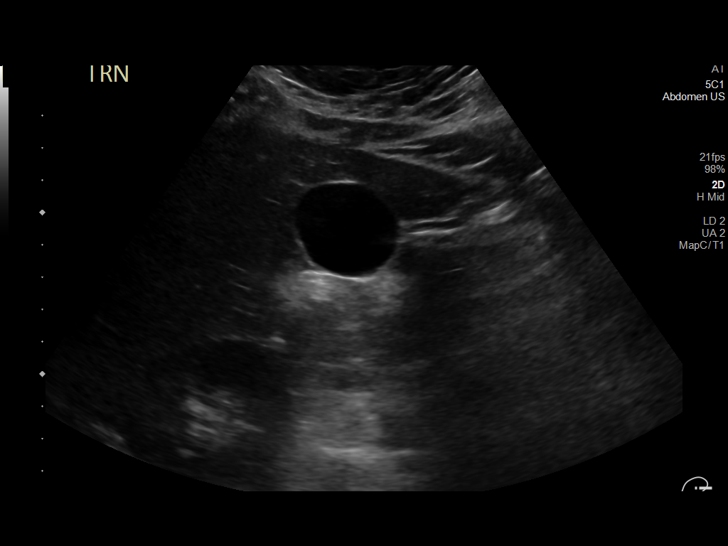
[im 11/33]
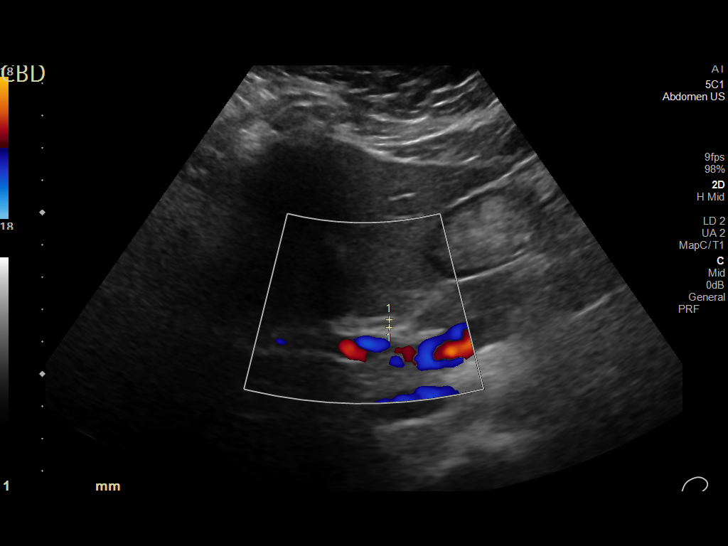
[im 13/33]
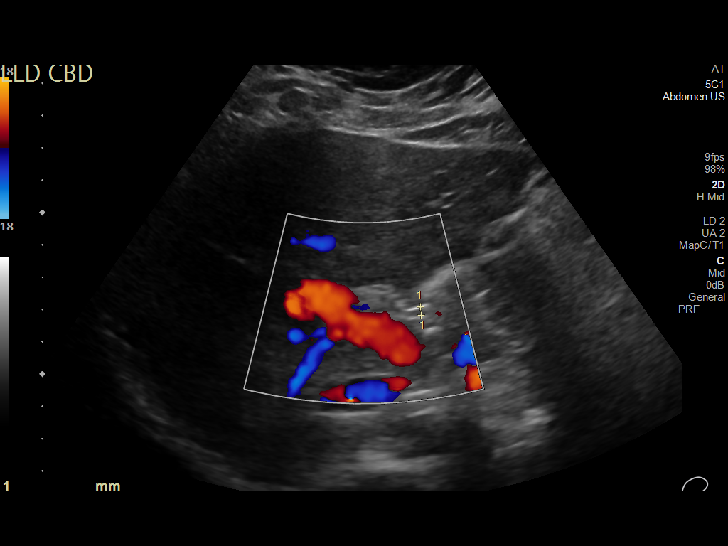
[im 15/33]
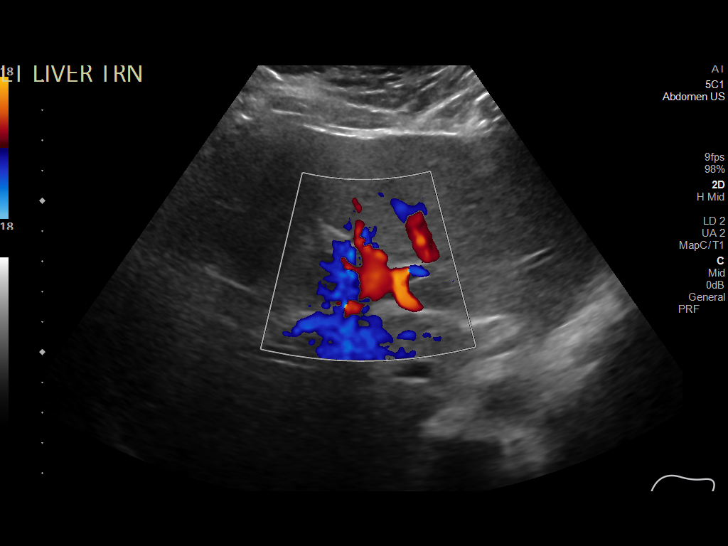
[im 18/33]
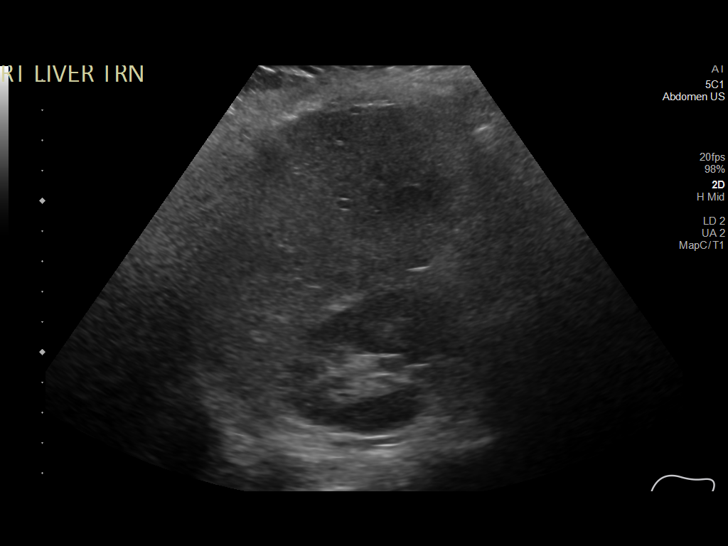
[im 21/33]
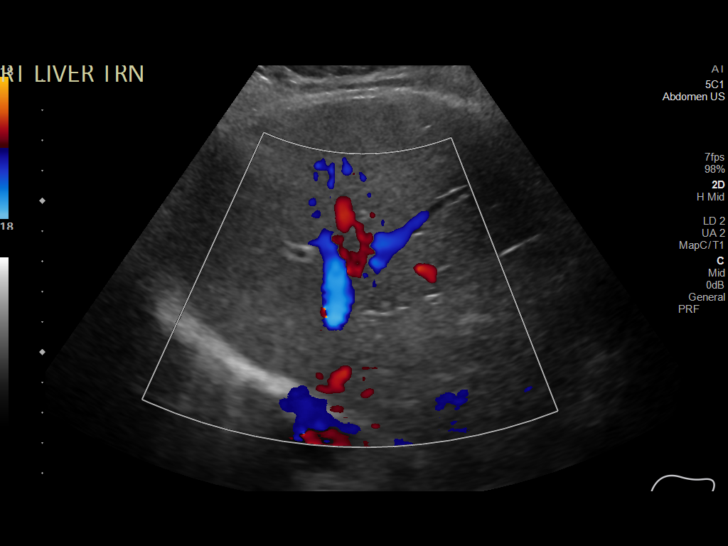
[im 22/33]
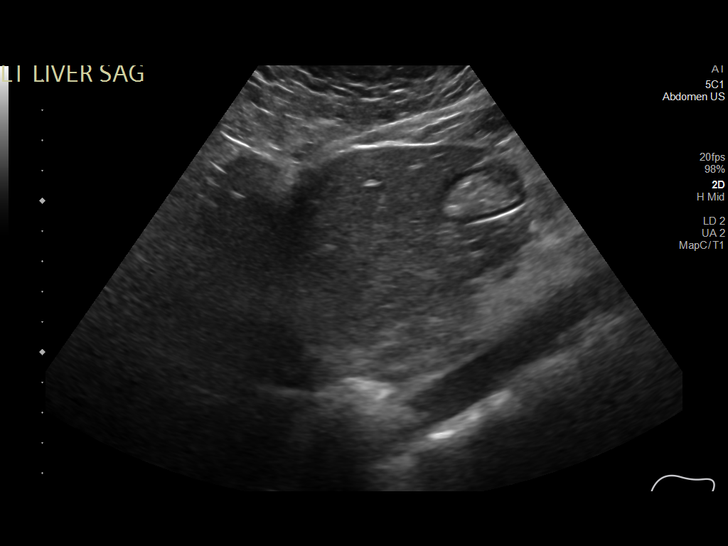
[im 25/33]
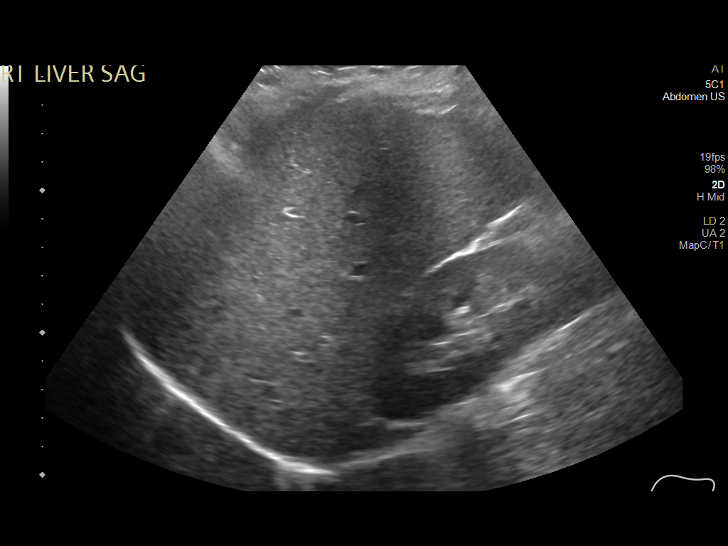
[im 27/33]
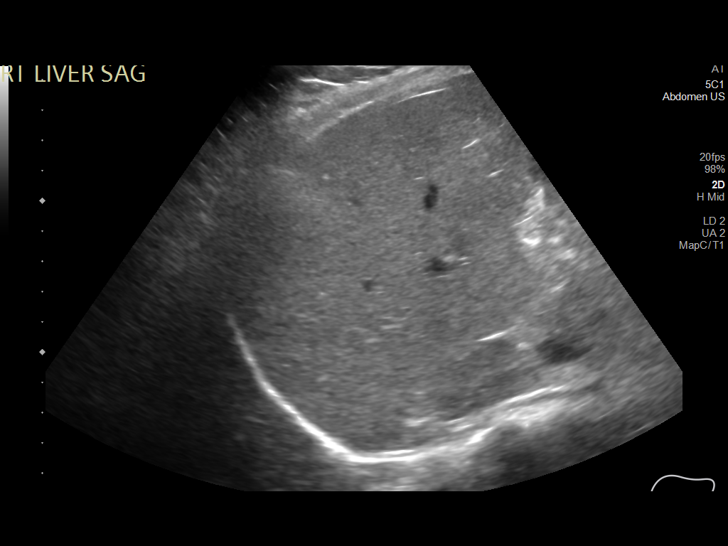
[im 30/33]
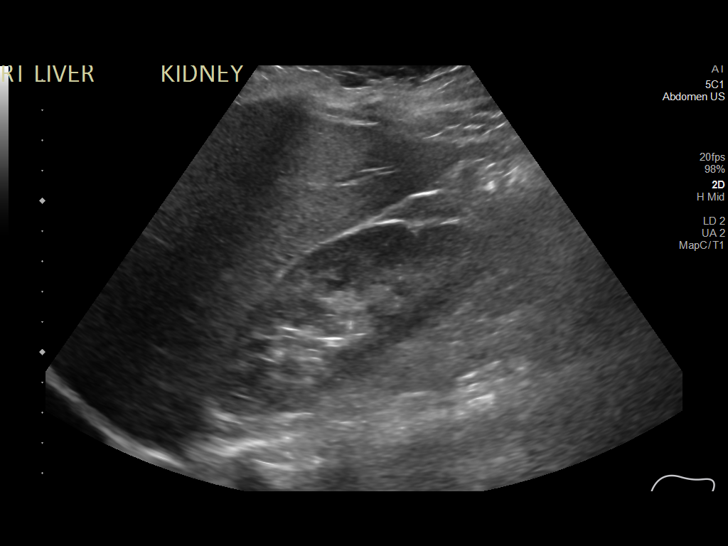
[im 33/33]
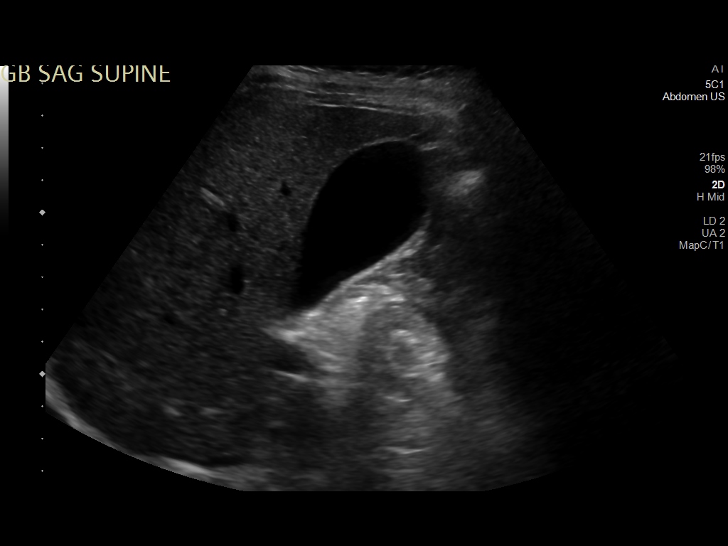

[14 of 25 positions shown; findings below may reference images not displayed]

FINDINGS: Gallbladder:

No gallstones or wall thickening visualized (1.6 mm). No sonographic
Murphy sign noted by sonographer.

Common bile duct:

Diameter: 2.7 mm

Liver:

No focal lesion identified. Within normal limits in parenchymal
echogenicity. Portal vein is patent on color Doppler imaging with
normal direction of blood flow towards the liver.

Other: None.
IMPRESSION: Normal right upper quadrant ultrasound.

## 2022-02-08 ENCOUNTER — Emergency Department (HOSPITAL_COMMUNITY)
Admission: EM | Admit: 2022-02-08 | Discharge: 2022-02-08 | Disposition: A | Discharge status: Home / Self Care | Payer: Self-pay | Attending: Emergency Medicine | Admitting: Emergency Medicine

## 2022-02-08 ENCOUNTER — Emergency Department (HOSPITAL_COMMUNITY): Payer: Self-pay

## 2022-02-08 ENCOUNTER — Other Ambulatory Visit: Payer: Self-pay

## 2022-02-08 ENCOUNTER — Encounter (HOSPITAL_COMMUNITY): Payer: Self-pay | Admitting: Pharmacy Technician

## 2022-02-08 DIAGNOSIS — R1084 Generalized abdominal pain: Secondary | ICD-10-CM | POA: Insufficient documentation

## 2022-02-08 DIAGNOSIS — R42 Dizziness and giddiness: Secondary | ICD-10-CM | POA: Insufficient documentation

## 2022-02-08 DIAGNOSIS — N9489 Other specified conditions associated with female genital organs and menstrual cycle: Secondary | ICD-10-CM | POA: Insufficient documentation

## 2022-02-08 DIAGNOSIS — R112 Nausea with vomiting, unspecified: Secondary | ICD-10-CM | POA: Insufficient documentation

## 2022-02-08 DIAGNOSIS — R1010 Upper abdominal pain, unspecified: Secondary | ICD-10-CM

## 2022-02-08 LAB — COMPREHENSIVE METABOLIC PANEL
ALT: 39 U/L (ref 0–44)
AST: 29 U/L (ref 15–41)
Albumin: 4.5 g/dL (ref 3.5–5.0)
Alkaline Phosphatase: 50 U/L (ref 38–126)
Anion gap: 8 (ref 5–15)
BUN: 8 mg/dL (ref 6–20)
CO2: 22 mmol/L (ref 22–32)
Calcium: 9.2 mg/dL (ref 8.9–10.3)
Chloride: 109 mmol/L (ref 98–111)
Creatinine, Ser: 0.49 mg/dL (ref 0.44–1.00)
GFR, Estimated: >60 mL/min (ref >60)
Glucose, Bld: 112 mg/dL — ABNORMAL HIGH (ref 70–99)
Potassium: 3.8 mmol/L (ref 3.5–5.1)
Sodium: 139 mmol/L (ref 135–145)
Total Bilirubin: 0.5 mg/dL (ref 0.3–1.2)
Total Protein: 8.5 g/dL — ABNORMAL HIGH (ref 6.5–8.1)

## 2022-02-08 LAB — URINALYSIS, ROUTINE W REFLEX MICROSCOPIC
Bacteria, UA: NONE SEEN
Bilirubin Urine: NEGATIVE
Glucose, UA: NEGATIVE mg/dL
Hgb urine dipstick: NEGATIVE
Ketones, ur: NEGATIVE mg/dL
Leukocytes,Ua: NEGATIVE
Nitrite: NEGATIVE
Protein, ur: 30 mg/dL — AB
Specific Gravity, Urine: 1.024 (ref 1.005–1.030)
pH: 8 (ref 5.0–8.0)

## 2022-02-08 LAB — CBC
HCT: 40.7 % (ref 36.0–46.0)
Hemoglobin: 13.1 g/dL (ref 12.0–15.0)
MCH: 28.7 pg (ref 26.0–34.0)
MCHC: 32.2 g/dL (ref 30.0–36.0)
MCV: 89.3 fL (ref 80.0–100.0)
Platelets: 345 10*3/uL (ref 150–400)
RBC: 4.56 MIL/uL (ref 3.87–5.11)
RDW: 12.7 % (ref 11.5–15.5)
WBC: 12.1 10*3/uL — ABNORMAL HIGH (ref 4.0–10.5)
nRBC: 0 % (ref 0.0–0.2)

## 2022-02-08 LAB — LIPASE, BLOOD: Lipase: 22 U/L (ref 11–51)

## 2022-02-08 LAB — I-STAT BETA HCG BLOOD, ED (MC, WL, AP ONLY): I-stat hCG, quantitative: <5 m[IU]/mL (ref <5)

## 2022-02-08 MED ORDER — SODIUM CHLORIDE 0.9 % IV BOLUS
1000.0000 mL | Freq: Once | INTRAVENOUS | Status: AC
  Administered 2022-02-08: 1000 mL via INTRAVENOUS

## 2022-02-08 MED ORDER — ONDANSETRON HCL 4 MG/2ML IJ SOLN
4.0000 mg | Freq: Once | INTRAMUSCULAR | Status: AC
  Administered 2022-02-08: 4 mg via INTRAVENOUS
  Filled 2022-02-08: qty 2

## 2022-02-08 MED ORDER — ONDANSETRON 4 MG PO TBDP: 4.0000 mg | ORAL_TABLET | Freq: Three times a day (TID) | ORAL | 0 refills | Status: AC | PRN

## 2022-02-08 MED ORDER — ONDANSETRON 4 MG PO TBDP
4.0000 mg | ORAL_TABLET | Freq: Once | ORAL | Status: AC | PRN
  Administered 2022-02-08: 4 mg via ORAL

## 2022-02-08 NOTE — ED Notes (Signed)
Pt A&OX4 ambulatory at d/c with independent steady gait. Pt verbalized understanding of d/c instructions, prescription and follow up care. 

## 2022-02-08 NOTE — ED Provider Notes (Addendum)
[facility] EMERGENCY DEPARTMENT Provider Note   CSN: [id number] Arrival date & time: 02/08/22  1710     History  Chief Complaint  Patient presents with   Emesis    Rebekah Riddle is a 26 y.o. female with no significant past medical history here for evaluation of emesis.  Patient states she woke up this morning and had multiple episodes of NBNB emesis, up to 10-12 episodes.  Her persistent emesis led here to the ED.  She has had generalized upper abdominal cramping which she relates to her recent episodes of emesis.  No blood in her emesis.  Pain does not radiate to her back.  She had normal bowel movements, last BM this morning without any melena red per rectum.  She denies any chronic NSAID use, chronic EtOH use.  Denies illicit substance use.  States her mom did have a "GI bug" at the end of last week.  She denies any fever, cough, congestion, rhinorrhea, chest pain, shortness of breath, back pain, dysuria, hematuria, vaginal discharge or concern for STDs.  Patient feels like she is dehydrated and states she feels intermittently lightheaded when she goes from sitting to standing.  Denies any headache, head trauma. No CP, SOB, LE swelling.  HPI     Home Medications Prior to Admission medications   Medication Sig Start Date End Date Taking? Authorizing Provider  ondansetron (ZOFRAN-ODT) 4 MG disintegrating tablet Take 1 tablet (4 mg total) by mouth every 8 (eight) hours as needed for nausea or vomiting. 02/08/22  Yes Brittin Janik A, PA-C  clindamycin (CLEOCIN) 150 MG capsule Take 1 capsule (150 mg total) by mouth 3 (three) times daily. 09/11/20   Blane Ohara, MD  omeprazole (PRILOSEC) 20 MG capsule Take 1 capsule (20 mg total) by mouth daily. 08/31/21   Fayrene Helper, PA-C  ondansetron (ZOFRAN) 4 MG tablet Take 1 tablet (4 mg total) by mouth every 6 (six) hours. 08/31/21   Fayrene Helper, PA-C  predniSONE (STERAPRED UNI-PAK 21 TAB) 10 MG (21) TBPK tablet Take by mouth  daily. Take steroid taper as written 09/05/20   Hall-Potvin, [place], PA-C      Allergies    Patient has no known allergies.    Review of Systems   Review of Systems  Constitutional: Negative.   HENT: Negative.    Respiratory: Negative.    Cardiovascular: Negative.   Gastrointestinal:  Positive for abdominal pain, nausea and vomiting. Negative for abdominal distention, anal bleeding, blood in stool, constipation, diarrhea and rectal pain.  Genitourinary: Negative.   Musculoskeletal: Negative.   Skin: Negative.   Neurological: Negative.   All other systems reviewed and are negative.   Physical Exam Updated Vital Signs BP 105/68   Pulse 96   Temp 98.9 F (37.2 C) (Oral)   Resp 17   SpO2 100%  Physical Exam Vitals and nursing note reviewed.  Constitutional:      General: She is not in acute distress.    Appearance: She is well-developed. She is not ill-appearing, toxic-appearing or diaphoretic.  HENT:     Head: Normocephalic and atraumatic.     Nose: Nose normal.     Mouth/Throat:     Mouth: Mucous membranes are moist.  Eyes:     Pupils: Pupils are equal, round, and reactive to light.  Cardiovascular:     Rate and Rhythm: Normal rate.     Pulses: Normal pulses.     Heart sounds: Normal heart sounds.  Pulmonary:  Effort: Pulmonary effort is normal. No respiratory distress.     Breath sounds: Normal breath sounds.  Abdominal:     General: Bowel sounds are normal. There is no distension.     Palpations: Abdomen is soft.     Tenderness: There is abdominal tenderness. There is no right CVA tenderness, left CVA tenderness, guarding or rebound. Negative signs include Murphy's sign and McBurney's sign.     Hernia: No hernia is present.       Comments: Generalized tenderness to upper abdomen  Musculoskeletal:        General: Normal range of motion.     Cervical back: Normal range of motion.     Comments: No bony tenderness, compartments soft  Skin:    General: Skin  is warm and dry.     Capillary Refill: Capillary refill takes less than 2 seconds.  Neurological:     General: No focal deficit present.     Mental Status: She is alert.     Cranial Nerves: Cranial nerves 2-12 are intact.     Sensory: Sensation is intact.     Motor: Motor function is intact.     Coordination: Coordination is intact.     Gait: Gait is intact.     Comments: CN 2-12 grossly intact Ambulatory in room without any ataxic gait  Psychiatric:        Mood and Affect: Mood normal.    ED Results / Procedures / Treatments   Labs (all labs ordered are listed, but only abnormal results are displayed) Labs Reviewed  COMPREHENSIVE METABOLIC PANEL - Abnormal; Notable for the following components:      Result Value   Glucose, Bld 112 (*)    Total Protein 8.5 (*)    All other components within normal limits  CBC - Abnormal; Notable for the following components:   WBC 12.1 (*)    All other components within normal limits  URINALYSIS, ROUTINE W REFLEX MICROSCOPIC - Abnormal; Notable for the following components:   APPearance HAZY (*)    Protein, ur 30 (*)    All other components within normal limits  LIPASE, BLOOD  I-STAT BETA HCG BLOOD, ED ([facility], [facility], [facility] ONLY)    EKG None  Radiology US Abdomen Limited RUQ (LIVER/GB)  Result Date: 02/08/2022 CLINICAL DATA:  Right upper quadrant pain EXAM: ULTRASOUND ABDOMEN LIMITED RIGHT UPPER QUADRANT COMPARISON:  None Available. FINDINGS: Gallbladder: No gallstones or wall thickening visualized. No sonographic Murphy sign noted by sonographer. Common bile duct: Diameter: 3 mm Liver: Hyperechoic liver parenchyma. No focal liver lesion. Portal vein is patent on color Doppler imaging with normal direction of blood flow towards the liver. Other: None. IMPRESSION: Negative for gallstones or biliary dilatation Hyperechoic liver compatible with fatty liver. Electronically Signed   By: Marlan Palau M.D.   On: 02/08/2022 19:54    Procedures Procedures     Medications Ordered in ED Medications  ondansetron (ZOFRAN-ODT) disintegrating tablet 4 mg (4 mg Oral Given 02/08/22 1721)  sodium chloride 0.9 % bolus 1,000 mL (0 mLs Intravenous Stopped 02/08/22 2034)  ondansetron (ZOFRAN) injection 4 mg (4 mg Intravenous Given 02/08/22 1928)   ED Course/ Medical Decision Making/ A&P    26 year old no chronic medical problems here for evaluation of emesis which began earlier today.  Associated upper abdominal cramping.  No radiation.  She denies any bloody stools, EtOH use, chronic NSAID use.  Did have mother who had some sort of GI illness last week.  Patient does  endorse some lightheadedness when she goes from sitting to standing which she relates to being dehydrated from her greater than 10 episodes of emesis.  She otherwise has a nonfocal neuro exam without deficits.  No recent head trauma.  Does have some tenderness across her upper abdomen with negative Murphy sign.  She denies any urinary symptoms or concerns for pregnancy. No CP, SOB, LE swelling, cough to suggest intrathoracic etiology of her sx. We will treat her symptoms, plan on ultrasound reassess  Labs and imaging personally viewed and interpreted:  CBC WBC 12.1 Lipase 22 UA negative for infection Metabolic panel glucose 112 Pregnancy test negative [place] RUQ without acute abnormality EKG sinus tachycardia  Patient reassessed. Symptoms improved.  No emesis since being back from the waiting room. discussed labs and imaging.  Will p.o. challenge.  Dizziness improved with IV fluids, suspect this was due to dehydration  Patient reassessed.  Tolerated p.o. challenge.  Suspect viral gastroenteritis as cause of her symptoms.  We will have her follow-up with PCP, return for new or worsening symptoms  On repeat exam patient does not have a surgical abdomin and there are no peritoneal signs.  No indication of appendicitis, bowel obstruction, bowel perforation, cholecystitis, diverticulitis, PID, TOA,  torsion or ectopic pregnancy.    The patient has been appropriately medically screened and/or stabilized in the ED. I have low suspicion for any other emergent medical condition which would require further screening, evaluation or treatment in the ED or require inpatient management.  Patient is hemodynamically stable and in no acute distress.  Patient able to ambulate in department prior to ED.  Evaluation does not show acute pathology that would require ongoing or additional emergent interventions while in the emergency department or further inpatient treatment.  I have discussed the diagnosis with the patient and answered all questions.  Pain is been managed while in the emergency department and patient has no further complaints prior to discharge.  Patient is comfortable with plan discussed in room and is stable for discharge at this time.  I have discussed strict return precautions for returning to the emergency department.  Patient was encouraged to follow-up with PCP/specialist refer to at discharge.                             Medical Decision Making Amount and/or Complexity of Data Reviewed Independent Historian: friend External Data Reviewed: labs, radiology, ECG and notes. Labs: ordered. Decision-making details documented in ED Course. Radiology: ordered and independent interpretation performed. Decision-making details documented in ED Course.    Details: [place] RUQ Jan 2023 without stones ECG/medicine tests: ordered and independent interpretation performed. Decision-making details documented in ED Course.  Risk OTC drugs. Prescription drug management. Parenteral controlled substances. Diagnosis or treatment significantly limited by social determinants of health.    Final Clinical Impression(s) / ED Diagnoses Final diagnoses:  Nausea and vomiting, unspecified vomiting type  Upper abdominal pain    Rx / DC Orders ED Discharge Orders          Ordered    ondansetron (ZOFRAN-ODT)  4 MG disintegrating tablet  Every 8 hours PRN        02/08/22 2010               Lesean Woolverton A, PA-C 02/08/22 2045    Gloris Manchester, MD 02/08/22 2146

## 2022-12-09 ENCOUNTER — Telehealth: Payer: Self-pay

## 2022-12-09 NOTE — Telephone Encounter (Signed)
Telephoned patient at mobile number. Left a voice message with [facility] contact information. 

## 2023-01-12 ENCOUNTER — Ambulatory Visit: Payer: Self-pay | Admitting: Hematology and Oncology

## 2023-01-12 ENCOUNTER — Other Ambulatory Visit (HOSPITAL_COMMUNITY)
Admission: RE | Admit: 2023-01-12 | Discharge: 2023-01-12 | Disposition: A | Discharge status: Home / Self Care | Payer: Self-pay | Source: Ambulatory Visit | Attending: Obstetrics and Gynecology | Admitting: Obstetrics and Gynecology

## 2023-01-12 ENCOUNTER — Ambulatory Visit: Payer: Self-pay

## 2023-01-12 VITALS — BP 127/78 | Wt 147.0 lb

## 2023-01-12 DIAGNOSIS — R87610 Atypical squamous cells of undetermined significance on cytologic smear of cervix (ASC-US): Secondary | ICD-10-CM | POA: Insufficient documentation

## 2023-01-12 DIAGNOSIS — Z01812 Encounter for preprocedural laboratory examination: Secondary | ICD-10-CM

## 2023-01-12 NOTE — Progress Notes (Signed)
[name] is a 27 y.o. female who presents to [facility] clinic today with no complaints.    Pap Smear: Pap not smear completed today. Last Pap smear was ASCUS without HPV testing and was abnormal - ASCUS/ no HPV testing . Per patient has history of an abnormal Pap smear. Last Pap smear result is available in Epic.   Physical exam: Breasts Breasts symmetrical. No skin abnormalities bilateral breasts. No nipple retraction bilateral breasts. No nipple discharge bilateral breasts. No lymphadenopathy. No lumps palpated bilateral breasts.       Pelvic/Bimanual Pap is not indicated today   GYNECOLOGY CLINIC COLPOSCOPY PROCEDURE NOTE  [name] is a 27 y.o. No obstetric history on file. here for colposcopy for atypical squamous cellularity of undetermined significance (ASCUS) pap smear on 10/12/2022. Discussed role for HPV in cervical dysplasia, need for surveillance.  Patient given informed consent, signed copy in the chart, time out was performed.  Placed in lithotomy position. Cervix viewed with speculum and colposcope after application of acetic acid.   Colposcopy adequate? Yes  no visible lesions.  ECC specimen obtained. All specimens were labelled and sent to pathology.  Patient was given post procedure instructions.  Will follow up pathology and manage accordingly.  Routine preventative health maintenance measures emphasized.  Smoking History: Patient has never smoked and was not referred to quit line.    Patient Navigation: Patient education provided. Access to services provided for patient through [facility] program. No interpreter provided. No transportation provided   Colorectal Cancer Screening: Per patient has never had colonoscopy completed No complaints today.    Breast and Cervical Cancer Risk Assessment: Patient does not have family history of breast cancer, known genetic mutations, or radiation treatment to the chest before age 25. Patient does not have history  of cervical dysplasia, immunocompromised, or DES exposure in-utero.  Risk Assessment   No risk assessment data     A: [facility] exam without pap smear No complaints with benign exam.   P: Recommend annual clinical breast exam. Colposcopy today for abnormal Pap smear.  Ilda Basset A, NP 01/12/2023 1:26 PM

## 2023-01-15 LAB — SURGICAL PATHOLOGY

## 2023-12-29 ENCOUNTER — Other Ambulatory Visit: Payer: Self-pay | Admitting: *Deleted

## 2023-12-29 DIAGNOSIS — Z124 Encounter for screening for malignant neoplasm of cervix: Secondary | ICD-10-CM

## 2023-12-29 NOTE — Progress Notes (Signed)
 Patient: Rebekah Riddle           Date of Birth: 1996-08-08           MRN: [id number] Visit Date: 12/29/2023 PCP: Darol Elizabeth, NP  Cervical Cancer Screening Do you smoke?: No Have you ever had or been told you have an allergy to latex products?: No Marital status: Single Date of last pap smear: 1-2 yrs ago Date of last menstrual period: 12/23/23 Number of pregnancies: 0 Number of births: 0 Have you ever had any of the following? Hysterectomy: No Tubal ligation (tubes tied): No Abnormal bleeding: No Abnormal pap smear: Yes Venereal warts: No A sex partner with venereal warts: No A high risk* sex partner: No  Cervical Exam  Abnormal Observations: Normal Exam. Recommendations: Last Pap smear was 10/12/2022 at [facility] [place] and ASCUS that a colposcopy was completed 01/12/2023 that was benign. Per patient has history of 3 other abnormal Pap smears that no follow up was completed. Last Pap smear result is available in EPIC under media with her referral. Let patient know if today's Pap smear is normal that her next Pap smear will be due in one year due to her history of abnormal Pap smear. Informed patient will follow up with her within the next couple of weeks on results by letter or phone. Patient verbalized understanding.      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    Family History  Problem Relation Age of Onset   Hypertension Mother    Depression Mother    Diabetes Father    Hyperlipidemia Father    Depression Sister    Diabetes Brother    Depression Sister     Social History   Occupational  History   Not on file  Tobacco Use   Smoking status: Never   Smokeless tobacco: Never  Vaping Use   Vaping status: Never Used  Substance and Sexual Activity   Alcohol use: Yes    Comment: once monthly   Drug use: No   Sexual activity: Yes    Birth control/protection: Condom, Coitus interruptus

## 2023-12-30 LAB — CYTOLOGY - PAP: Diagnosis: NEGATIVE

## 2024-01-03 ENCOUNTER — Ambulatory Visit: Payer: Self-pay
